# Patient Record
Sex: Female | Born: 1995 | Race: White | Hispanic: No | Marital: Single | Smoking: Never smoker
Health system: Southern US, Community
[De-identification: ages and names within clinical notes are randomized; demographics above are authoritative.]

## PROBLEM LIST (undated history)

## (undated) HISTORY — PX: ANTERIOR CRUCIATE LIGAMENT REPAIR: SHX115

## (undated) HISTORY — PX: APPENDECTOMY: SHX54

---

## 2017-03-01 ENCOUNTER — Ambulatory Visit (INDEPENDENT_AMBULATORY_CARE_PROVIDER_SITE_OTHER): Payer: BLUE CROSS/BLUE SHIELD

## 2017-03-01 ENCOUNTER — Ambulatory Visit (HOSPITAL_COMMUNITY)
Admission: EM | Admit: 2017-03-01 | Discharge: 2017-03-01 | Disposition: A | Discharge status: Home / Self Care | Payer: BLUE CROSS/BLUE SHIELD | Attending: Family Medicine | Admitting: Family Medicine

## 2017-03-01 ENCOUNTER — Encounter (HOSPITAL_COMMUNITY): Payer: Self-pay | Admitting: Emergency Medicine

## 2017-03-01 DIAGNOSIS — Z3202 Encounter for pregnancy test, result negative: Secondary | ICD-10-CM

## 2017-03-01 DIAGNOSIS — M940 Chondrocostal junction syndrome [Tietze]: Secondary | ICD-10-CM | POA: Diagnosis not present

## 2017-03-01 DIAGNOSIS — J40 Bronchitis, not specified as acute or chronic: Secondary | ICD-10-CM

## 2017-03-01 LAB — POCT PREGNANCY, URINE: PREG TEST UR: NEGATIVE

## 2017-03-01 MED ORDER — AZITHROMYCIN 250 MG PO TABS: 250.0000 mg | ORAL_TABLET | Freq: Every day | ORAL | 0 refills | Status: DC

## 2017-03-01 NOTE — ED Provider Notes (Signed)
[facility]    CSN: [id number] Arrival date & time: 03/01/17  1431     History   Chief Complaint Chief Complaint  Patient presents with  . Cough    HPI Angelica Rivera is a 21 y.o. female.   21 year old healthy female comes in for 10 day history of cough. States first started out like flu like symptoms 3 weeks ago with fever, headache, 1 episode of vomiting. Tmax 101 that was controlled by antipyretics, last fever about 3 weeks ago. She also had some sore throat that has since resolved. She took theraflu, which helped with her symptoms. She started having cough 10 days ago and now has right lower rib pain with and without coughing. Productive cough with shortness of breath when laying down. Substernal chest pain with coughing. No wheezing.  Denies nasal congestion, rhinorrhea, ear pain, eye pain. Deneis history of seasonal allergies. Never smoker.       History reviewed. No pertinent past medical history.  There are no active problems to display for this patient.   Past Surgical History:  Procedure Laterality Date  . ANTERIOR CRUCIATE LIGAMENT REPAIR Left   . APPENDECTOMY      OB History    No data available       Home Medications    Prior to Admission medications   Medication Sig Start Date End Date Taking? Authorizing Provider  azithromycin (ZITHROMAX) 250 MG tablet Take 1 tablet (250 mg total) by mouth daily. Take first 2 tablets together, then 1 every day until finished. 03/01/17   Belinda Fisher, PA-C    Family History History reviewed. No pertinent family history.  Social History Social History  Substance Use Topics  . Smoking status: Never Smoker  . Smokeless tobacco: Never Used  . Alcohol use Yes     Comment: social     Allergies   Patient has no known allergies.   Review of Systems Review of Systems  Reason unable to perform ROS: See HPI as above.     Physical Exam Triage Vital Signs ED Triage Vitals [03/01/17 1454]  Enc Vitals  Group     BP      Pulse      Resp      Temp      Temp src      SpO2      Weight      Height      Head Circumference      Peak Flow      Pain Score 8     Pain Loc      Pain Edu?      Excl. in GC?    No data found.   Updated Vital Signs BP 127/87 (BP Location: Right Arm)   Pulse 81   Temp 97.8 F (36.6 C) (Oral)   LMP 02/08/2017 (Exact Date)   SpO2 98%   Physical Exam  Constitutional: She is oriented to person, place, and time. She appears well-developed and well-nourished. No distress.  HENT:  Head: Normocephalic and atraumatic.  Right Ear: Tympanic membrane, external ear and ear canal normal. Tympanic membrane is not erythematous and not bulging.  Left Ear: Tympanic membrane, external ear and ear canal normal. Tympanic membrane is not erythematous and not bulging.  Nose: Nose normal. Right sinus exhibits no maxillary sinus tenderness and no frontal sinus tenderness. Left sinus exhibits no maxillary sinus tenderness and no frontal sinus tenderness.  Mouth/Throat: Uvula is midline, oropharynx is clear and moist  and mucous membranes are normal.  Eyes: Pupils are equal, round, and reactive to light. Conjunctivae and EOM are normal.  Neck: Normal range of motion. Neck supple.  Cardiovascular: Normal rate, regular rhythm and normal heart sounds.  Exam reveals no gallop and no friction rub.   No murmur heard. Pulmonary/Chest: Effort normal and breath sounds normal. She has no decreased breath sounds. She has no wheezes. She has no rhonchi. She has no rales. She exhibits tenderness (right rib/chest).  Lymphadenopathy:    She has no cervical adenopathy.  Neurological: She is alert and oriented to person, place, and time.  Skin: Skin is warm and dry.  Psychiatric: She has a normal mood and affect. Her behavior is normal. Judgment normal.     UC Treatments / Results  Labs (all labs ordered are listed, but only abnormal results are displayed) Labs Reviewed  POCT PREGNANCY,  URINE    EKG  EKG Interpretation None       Radiology Dg Chest 2 View  Result Date: 03/01/2017 CLINICAL DATA:  Cough for 10 days. Shortness of breath and chest pain for 1 day. EXAM: CHEST  2 VIEW COMPARISON:  None. FINDINGS: The cardiomediastinal silhouette is unremarkable. Mild peribronchial thickening noted. There is no evidence of focal airspace disease, pulmonary edema, suspicious pulmonary nodule/mass, pleural effusion, or pneumothorax. No acute bony abnormalities are identified. IMPRESSION: Mild peribronchial thickening of uncertain chronicity. No evidence of focal pneumonia. Electronically Signed   By: Harmon PierJeffrey  Hu M.D.   On: 03/01/2017 15:30    Procedures Procedures (including critical care time)  Medications Ordered in UC Medications - No data to display   Initial Impression / Assessment and Plan / UC Course  I have reviewed the triage vital signs and the nursing notes.  Pertinent labs & imaging results that were available during my care of the patient were reviewed by me and considered in my medical decision making (see chart for details).    Discussed xray results with patient. Start azithromycin as directed. Other symptomatic treatment discussed. Return precautions given.   Final Clinical Impressions(s) / UC Diagnoses   Final diagnoses:  Bronchitis  Costochondritis    New Prescriptions Discharge Medication List as of 03/01/2017  3:40 PM    START taking these medications   Details  azithromycin (ZITHROMAX) 250 MG tablet Take 1 tablet (250 mg total) by mouth daily. Take first 2 tablets together, then 1 every day until finished., Starting Wed 03/01/2017, Normal         Linward HeadlandYu, Wendy Mikles V, PA-C 03/01/17 1635

## 2017-03-01 NOTE — Discharge Instructions (Signed)
Azithromycin as directed. You can start flonase, zyrtec-D for nasal congestion. You can use over the counter nasal saline rinse such as neti pot for nasal congestion. Keep hydrated, your urine should be clear to pale yellow in color. Tylenol/motrin for fever and pain. Monitor for any worsening of symptoms, chest pain, shortness of breath, wheezing, swelling of the throat, follow up for reevaluation.

## 2017-03-01 NOTE — ED Triage Notes (Signed)
Pt complains of cough x10 days.  She states she had flu like symptoms about three weeks ago and after recovering from that she developed the cough.  She has taken several OTC medications but the cough will not go away.  She now complains of right sided lower rib cage pain from the coughing.  She did report a fever of 101 for two days when she felt like she had the flu, but was able to keep the fever down with OTC medication.

## 2017-10-04 ENCOUNTER — Encounter (HOSPITAL_COMMUNITY): Payer: Self-pay | Admitting: Family Medicine

## 2017-10-04 ENCOUNTER — Ambulatory Visit (HOSPITAL_COMMUNITY)
Admission: EM | Admit: 2017-10-04 | Discharge: 2017-10-04 | Disposition: A | Discharge status: Home / Self Care | Payer: BLUE CROSS/BLUE SHIELD | Attending: Family Medicine | Admitting: Family Medicine

## 2017-10-04 ENCOUNTER — Other Ambulatory Visit: Payer: Self-pay

## 2017-10-04 DIAGNOSIS — R300 Dysuria: Secondary | ICD-10-CM | POA: Diagnosis not present

## 2017-10-04 DIAGNOSIS — J029 Acute pharyngitis, unspecified: Secondary | ICD-10-CM | POA: Diagnosis not present

## 2017-10-04 DIAGNOSIS — B9789 Other viral agents as the cause of diseases classified elsewhere: Secondary | ICD-10-CM | POA: Diagnosis not present

## 2017-10-04 LAB — POCT URINALYSIS DIP (DEVICE)
Bilirubin Urine: NEGATIVE
GLUCOSE, UA: 100 mg/dL — AB
Ketones, ur: NEGATIVE mg/dL
Leukocytes, UA: NEGATIVE
Nitrite: NEGATIVE
PH: 7 (ref 5.0–8.0)
PROTEIN: NEGATIVE mg/dL
Specific Gravity, Urine: 1.01 (ref 1.005–1.030)
UROBILINOGEN UA: 0.2 mg/dL (ref 0.0–1.0)

## 2017-10-04 LAB — POCT RAPID STREP A: STREPTOCOCCUS, GROUP A SCREEN (DIRECT): NEGATIVE

## 2017-10-04 MED ORDER — CHLORHEXIDINE GLUCONATE 0.12 % MT SOLN: 15.0000 mL | Freq: Two times a day (BID) | OROMUCOSAL | 0 refills | Status: AC

## 2017-10-04 NOTE — ED Provider Notes (Signed)
[facility]   [id number] 10/04/17 Arrival Time: 1455   SUBJECTIVE:  Angelica Rivera is a 22 y.o. female who presents to the urgent care with complaint of sore throat which began on Sunday and is associated with night sweats.  No cough or ear pain  Also, patient has been noting dysuria without flank pain.  Patient does cross-fit training.    History reviewed. No pertinent past medical history. Family History  Problem Relation Age of Onset  . Healthy Mother   . Healthy Father    Social History   Socioeconomic History  . Marital status: Single    Spouse name: Not on file  . Number of children: Not on file  . Years of education: Not on file  . Highest education level: Not on file  Occupational History  . Not on file  Social Needs  . Financial resource strain: Not on file  . Food insecurity:    Worry: Not on file    Inability: Not on file  . Transportation needs:    Medical: Not on file    Non-medical: Not on file  Tobacco Use  . Smoking status: Never Smoker  . Smokeless tobacco: Never Used  Substance and Sexual Activity  . Alcohol use: Yes    Comment: social  . Drug use: No  . Sexual activity: Not on file  Lifestyle  . Physical activity:    Days per week: Not on file    Minutes per session: Not on file  . Stress: Not on file  Relationships  . Social connections:    Talks on phone: Not on file    Gets together: Not on file    Attends religious service: Not on file    Active member of club or organization: Not on file    Attends meetings of clubs or organizations: Not on file    Relationship status: Not on file  . Intimate partner violence:    Fear of current or ex partner: Not on file    Emotionally abused: Not on file    Physically abused: Not on file    Forced sexual activity: Not on file  Other Topics Concern  . Not on file  Social History Narrative  . Not on file   No outpatient medications have been marked as taking for the 10/04/17  encounter [facility] Encounter).   No Known Allergies    ROS: As per HPI, remainder of ROS negative.   OBJECTIVE:   Vitals:   10/04/17 1524  BP: 129/87  Pulse: 79  Resp: 18  Temp: 98.1 F (36.7 C)  TempSrc: Oral  SpO2: 100%     General appearance: alert; no distress Eyes: PERRL; EOMI; conjunctiva normal HENT: normocephalic; atraumatic; TMs normal, canal normal, external ears normal without trauma; nasal mucosa normal; oral enlarged tonsils which are reddened Neck: supple; no adenopathy Back: no CVA tenderness Extremities: no cyanosis or edema; symmetrical with no gross deformities Skin: warm and dry Neurologic: normal gait; grossly normal Psychological: alert and cooperative; normal mood and affect      Labs:  Results for orders placed or performed during the hospital encounter of 10/04/17  POCT rapid strep A [facility])  Result Value Ref Range   Streptococcus, Group A Screen (Direct) NEGATIVE NEGATIVE  POCT urinalysis dip (device)  Result Value Ref Range   Glucose, UA 100 (A) NEGATIVE mg/dL   Bilirubin Urine NEGATIVE NEGATIVE   Ketones, ur NEGATIVE NEGATIVE mg/dL   Specific Gravity, Urine 1.010 1.005 -  1.030   Hgb urine dipstick TRACE (A) NEGATIVE   pH 7.0 5.0 - 8.0   Protein, ur NEGATIVE NEGATIVE mg/dL   Urobilinogen, UA 0.2 0.0 - 1.0 mg/dL   Nitrite NEGATIVE NEGATIVE   Leukocytes, UA NEGATIVE NEGATIVE    Labs Reviewed  POCT URINALYSIS DIP (DEVICE) - Abnormal; Notable for the following components:      Result Value   Glucose, UA 100 (*)    Hgb urine dipstick TRACE (*)    All other components within normal limits  CULTURE, GROUP A STREP [facility])  POCT RAPID STREP A    No results found.     ASSESSMENT & PLAN:  1. Viral pharyngitis   2. Dysuria   Your strep test is negative.  I am prescribing an antiseptic mouthwash that you can use that should hasten the resolution of symptoms.  He simply gargle twice a day for the next 5 days.  There  is no evidence for urinary tract infection.   Meds ordered this encounter  Medications  . chlorhexidine (PERIDEX) 0.12 % solution    Sig: Use as directed 15 mLs in the mouth or throat 2 (two) times daily.    Dispense:  120 mL    Refill:  0    Reviewed expectations re: course of current medical issues. Questions answered. Outlined signs and symptoms indicating need for more acute intervention. Patient verbalized understanding. After Visit Summary given.    Procedures:      Elvina Sidle, MD 10/04/17 [phone]

## 2017-10-04 NOTE — ED Triage Notes (Addendum)
Sore throat started Sunday night.  Patient also has burning with urination

## 2017-10-04 NOTE — Discharge Instructions (Addendum)
Your strep test is negative.  I am prescribing an antiseptic mouthwash that you can use that should hasten the resolution of symptoms.  He simply gargle twice a day for the next 5 days.  There is no evidence for urinary tract infection.

## 2017-10-07 LAB — CULTURE, GROUP A STREP (THRC)

## 2017-11-22 ENCOUNTER — Encounter (HOSPITAL_COMMUNITY): Payer: Self-pay | Admitting: Emergency Medicine

## 2017-11-22 ENCOUNTER — Ambulatory Visit (HOSPITAL_COMMUNITY)
Admission: EM | Admit: 2017-11-22 | Discharge: 2017-11-22 | Disposition: A | Discharge status: Home / Self Care | Payer: BLUE CROSS/BLUE SHIELD | Attending: Internal Medicine | Admitting: Internal Medicine

## 2017-11-22 DIAGNOSIS — J039 Acute tonsillitis, unspecified: Secondary | ICD-10-CM | POA: Diagnosis not present

## 2017-11-22 DIAGNOSIS — J029 Acute pharyngitis, unspecified: Secondary | ICD-10-CM

## 2017-11-22 LAB — POCT RAPID STREP A: STREPTOCOCCUS, GROUP A SCREEN (DIRECT): NEGATIVE

## 2017-11-22 LAB — POCT INFECTIOUS MONO SCREEN: Mono Screen: NEGATIVE

## 2017-11-22 MED ORDER — AMOXICILLIN 500 MG PO CAPS: 500.0000 mg | ORAL_CAPSULE | Freq: Three times a day (TID) | ORAL | 0 refills | Status: AC

## 2017-11-22 NOTE — ED Triage Notes (Signed)
Pt sts sore throat, fever and body aches

## 2017-11-22 NOTE — Discharge Instructions (Signed)
Sore Throat  Your rapid strep tested Negative today as well as mono. We will send for a culture and call in about 2 days if results are positive. I am going to go ahead and start you on amoxicillin given exam/appearance of tonsills.  Please continue Tylenol or Ibuprofen for fever and pain. May try salt water gargles, cepacol lozenges, throat spray, or OTC cold relief medicine for throat discomfort. If you also have congestion take a daily anti-histamine like Zyrtec, Claritin, and a oral decongestant to help with post nasal drip that may be irritating your throat.   Stay hydrated and drink plenty of fluids to keep your throat coated relieve irritation.

## 2017-11-22 NOTE — ED Provider Notes (Signed)
[facility]    CSN: [id number] Arrival date & time: 11/22/17  1204     History   Chief Complaint Chief Complaint  Patient presents with  . Sore Throat    HPI Angelica Rivera is a 22 y.o. female history of tonsil stones presenting today for evaluation of fever, sore throat and body aches.  Patient states that she started to develop some stones on Sunday, but since she has had worsening sore throat, fever and body aches.  Fever up to 101.  States that her stones typically pass on their own if she is unable to remove them.  HPI  History reviewed. No pertinent past medical history.  There are no active problems to display for this patient.   Past Surgical History:  Procedure Laterality Date  . ANTERIOR CRUCIATE LIGAMENT REPAIR Left   . APPENDECTOMY      OB History   None      Home Medications    Prior to Admission medications   Medication Sig Start Date End Date Taking? Authorizing Provider  amoxicillin (AMOXIL) 500 MG capsule Take 1 capsule (500 mg total) by mouth 3 (three) times daily for 10 days. 11/22/17 12/02/17  Wieters, Hallie C, PA-C  chlorhexidine (PERIDEX) 0.12 % solution Use as directed 15 mLs in the mouth or throat 2 (two) times daily. 10/04/17   Elvina Sidle, MD    Family History Family History  Problem Relation Age of Onset  . Healthy Mother   . Healthy Father     Social History Social History   Tobacco Use  . Smoking status: Never Smoker  . Smokeless tobacco: Never Used  Substance Use Topics  . Alcohol use: Yes    Comment: social  . Drug use: No     Allergies   Patient has no known allergies.   Review of Systems Review of Systems  Constitutional: Positive for fever. Negative for activity change, appetite change, chills and fatigue.  HENT: Positive for sore throat. Negative for congestion, ear pain, rhinorrhea, sinus pressure and trouble swallowing.   Eyes: Negative for discharge and redness.  Respiratory: Negative for  cough, chest tightness and shortness of breath.   Cardiovascular: Negative for chest pain.  Gastrointestinal: Negative for abdominal pain, diarrhea, nausea and vomiting.  Musculoskeletal: Positive for myalgias.  Skin: Negative for rash.  Neurological: Negative for dizziness, light-headedness and headaches.     Physical Exam Triage Vital Signs ED Triage Vitals  Enc Vitals Group     BP 11/22/17 1220 131/67     Pulse Rate 11/22/17 1220 87     Resp 11/22/17 1220 16     Temp 11/22/17 1220 99.5 F (37.5 C)     Temp Source 11/22/17 1220 Oral     SpO2 11/22/17 1220 99 %     Weight --      Height --      Head Circumference --      Peak Flow --      Pain Score 11/22/17 1221 8     Pain Loc --      Pain Edu? --      Excl. in GC? --    No data found.  Updated Vital Signs BP 131/67 (BP Location: Left Arm)   Pulse 87   Temp 99.5 F (37.5 C) (Oral)   Resp 16   SpO2 99%   Visual Acuity Right Eye Distance:   Left Eye Distance:   Bilateral Distance:    Right Eye Near:  Left Eye Near:    Bilateral Near:     Physical Exam  Constitutional: She appears well-developed and well-nourished. No distress.  HENT:  Head: Normocephalic and atraumatic.  Bilateral ears without tenderness to palpation of external auricle, tragus and mastoid, EAC's without erythema or swelling, TM's with good bony landmarks and cone of light. Non erythematous.  Oral mucosa pink and moist, moderate tonsillar enlargement with erythema, exudate versus stones present. Posterior pharynx patent and nonerythematous, no uvula deviation or swelling. Normal phonation.  Eyes: Conjunctivae are normal.  Neck: Neck supple.  Cardiovascular: Normal rate and regular rhythm.  No murmur heard. Pulmonary/Chest: Effort normal and breath sounds normal. No respiratory distress.  Breathing comfortably at rest, CTABL, no wheezing, rales or other adventitious sounds auscultated  Abdominal: Soft. There is no tenderness.    Musculoskeletal: She exhibits no edema.  Neurological: She is alert.  Skin: Skin is warm and dry.  Psychiatric: She has a normal mood and affect.  Nursing note and vitals reviewed.    UC Treatments / Results  Labs (all labs ordered are listed, but only abnormal results are displayed) Labs Reviewed  CULTURE, GROUP A STREP [facility]  POCT RAPID STREP A  POCT INFECTIOUS MONO SCREEN    EKG None  Radiology No results found.  Procedures Procedures (including critical care time)  Medications Ordered in UC Medications - No data to display  Initial Impression / Assessment and Plan / UC Course  I have reviewed the triage vital signs and the nursing notes.  Pertinent labs & imaging results that were available during my care of the patient were reviewed by me and considered in my medical decision making (see chart for details).     Strep negative, mono negative.  Given appearance of tonsils will go ahead and empirically treat with amoxicillin.  Discussed further OTC measures to control sore throat.  Tylenol and ibuprofen for fever and body aches.Discussed strict return precautions. Patient verbalized understanding and is agreeable with plan.  Final Clinical Impressions(s) / UC Diagnoses   Final diagnoses:  Tonsillitis     Discharge Instructions     Sore Throat  Your rapid strep tested Negative today as well as mono. We will send for a culture and call in about 2 days if results are positive. I am going to go ahead and start you on amoxicillin given exam/appearance of tonsills.  Please continue Tylenol or Ibuprofen for fever and pain. May try salt water gargles, cepacol lozenges, throat spray, or OTC cold relief medicine for throat discomfort. If you also have congestion take a daily anti-histamine like Zyrtec, Claritin, and a oral decongestant to help with post nasal drip that may be irritating your throat.   Stay hydrated and drink plenty of fluids to keep your throat coated  relieve irritation.     ED Prescriptions    Medication Sig Dispense Auth. Provider   amoxicillin (AMOXIL) 500 MG capsule Take 1 capsule (500 mg total) by mouth 3 (three) times daily for 10 days. 30 capsule Wieters, Hallie C, PA-C     Controlled Substance Prescriptions [place] Controlled Substance Registry consulted? Not Applicable   Lew DawesWieters, Hallie C, [place]PA-C 11/22/17 1337

## 2017-11-25 LAB — CULTURE, GROUP A STREP (THRC)

## 2018-08-31 IMAGING — DX DG CHEST 2V
2 series · 2 of 2 positions shown · non-contrast
Comparison: None.

CLINICAL DATA: Cough for 10 days. Shortness of breath and chest
pain for 1 day.

EXAM:
CHEST  2 VIEW

[chest pa]
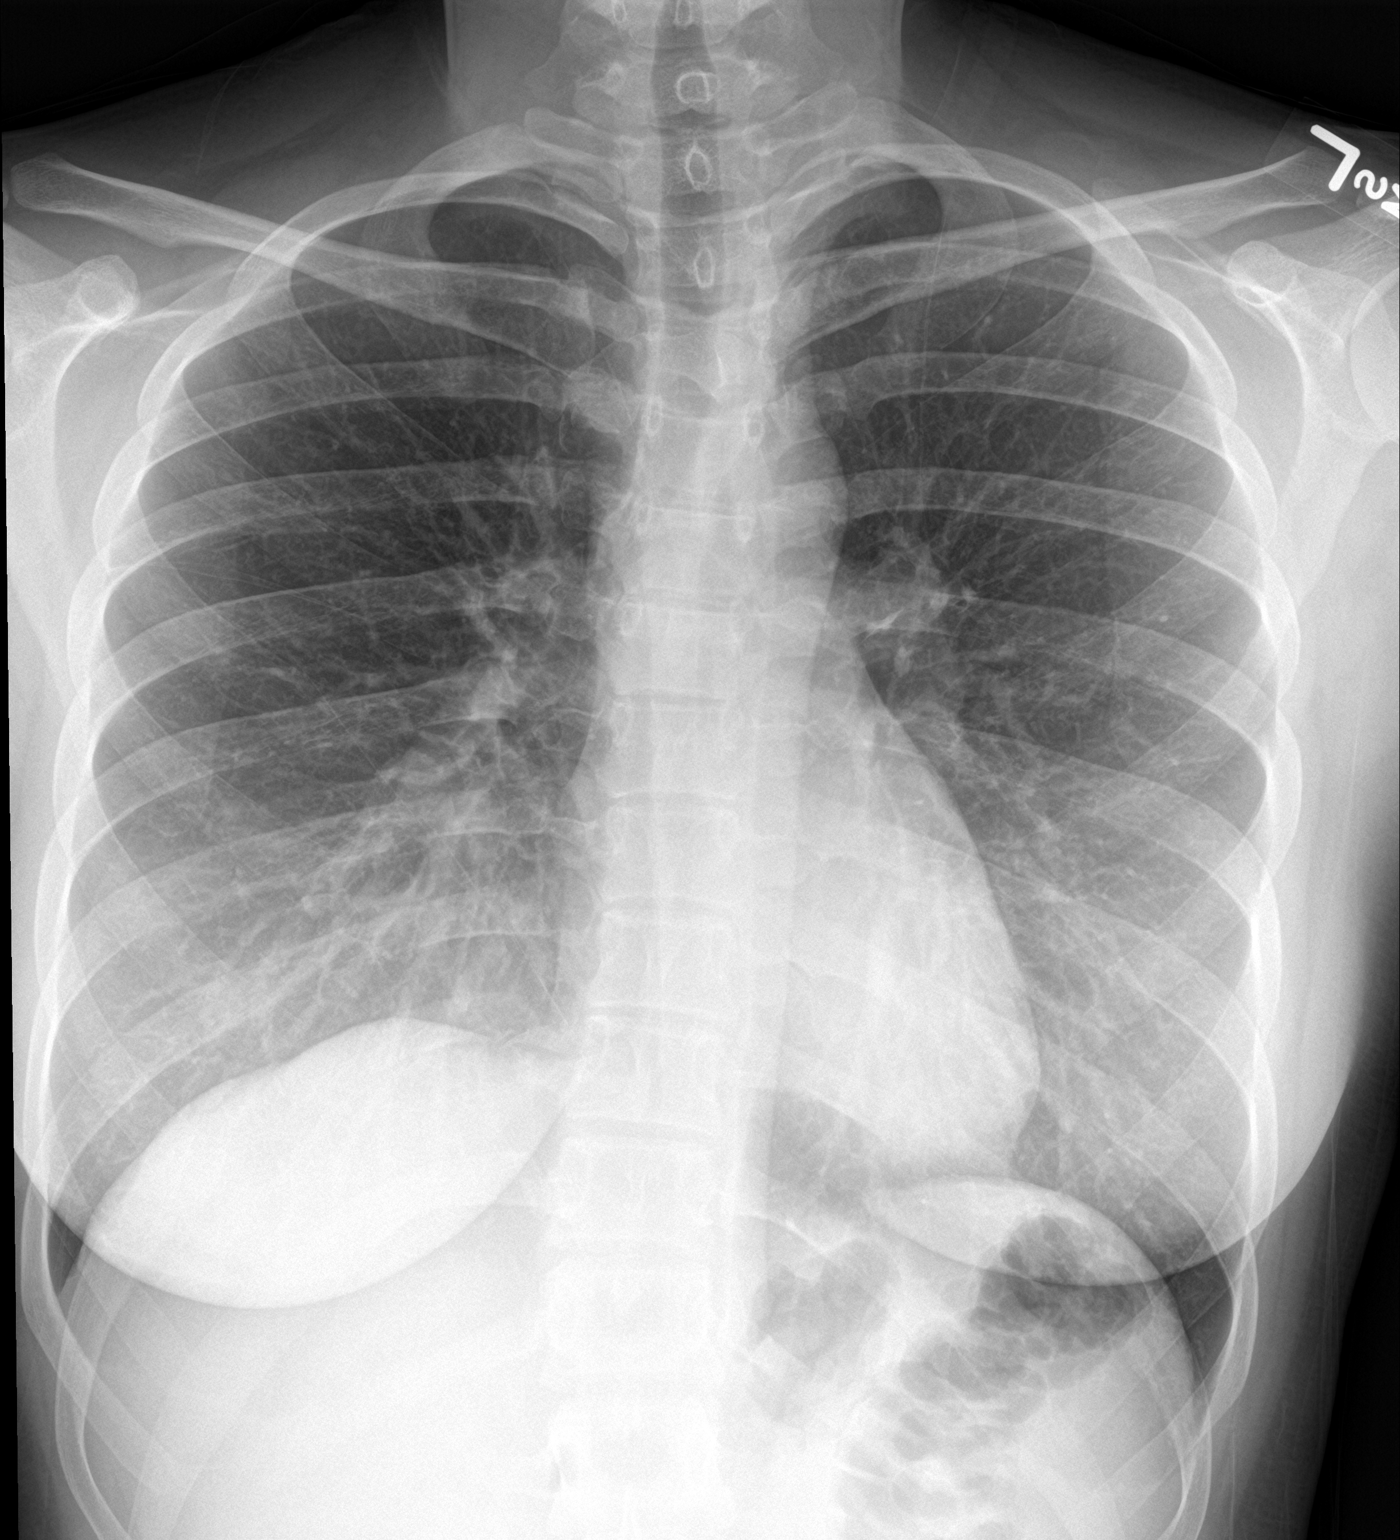

[chest lat]
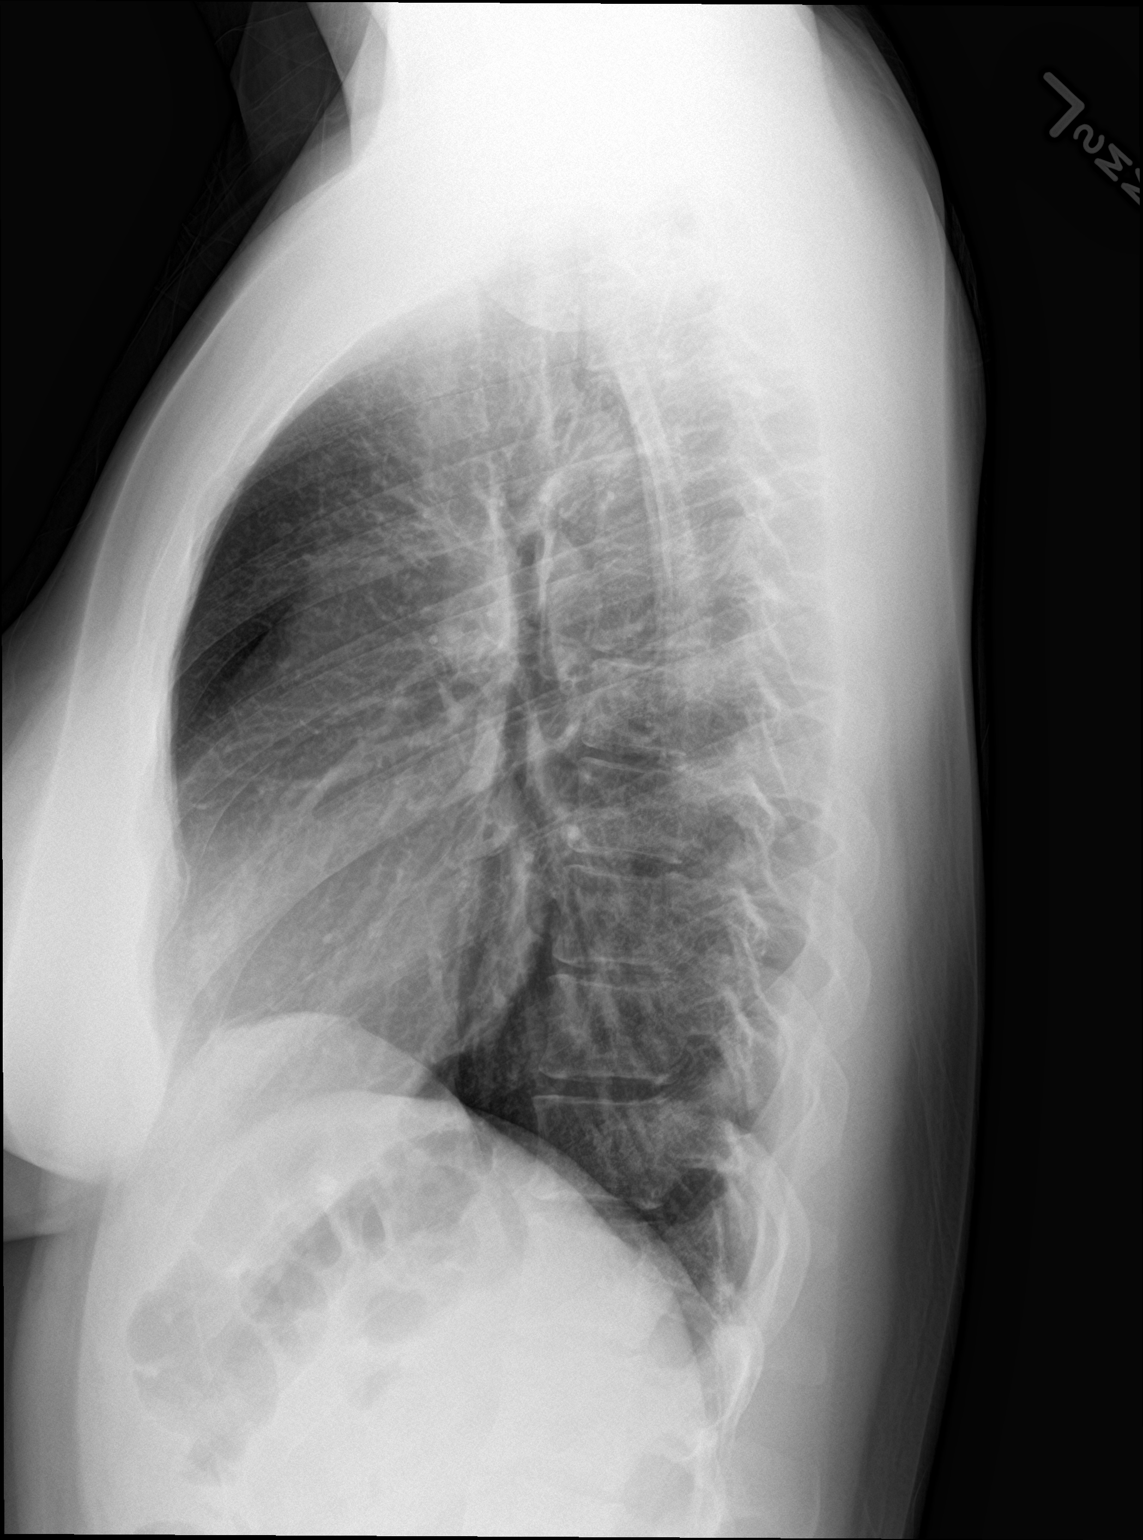

[2 of 2 positions shown; findings below may reference images not displayed]

FINDINGS: The cardiomediastinal silhouette is unremarkable.

Mild peribronchial thickening noted.

There is no evidence of focal airspace disease, pulmonary edema,
suspicious pulmonary nodule/mass, pleural effusion, or pneumothorax.
No acute bony abnormalities are identified.
IMPRESSION: Mild peribronchial thickening of uncertain chronicity. No evidence
of focal pneumonia.
